# Patient Record
Sex: Male | Born: 2000 | Race: White | Hispanic: No | Marital: Single | Smoking: Never smoker
Health system: Southern US, Community
[De-identification: ages and names within clinical notes are randomized; demographics above are authoritative.]

---
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
## 2022-03-03 IMAGING — CT CT RENAL STONE PROTOCOL
3 of 4 series · 10 of 46 positions shown, 15 images · non-contrast
Comparison: None.

CLINICAL DATA: Left flank pain

EXAM:
CT ABDOMEN AND PELVIS WITHOUT CONTRAST
TECHNIQUE: Multidetector CT imaging of the abdomen and pelvis was performed
following the standard protocol without oral or IV contrast.

[Series 4: lung bases · axial · 0.67mm/px · z∈[+471,+571]mm · 6 of 30 slices shown, 11 images]
[im 5/30  soft-tissue]
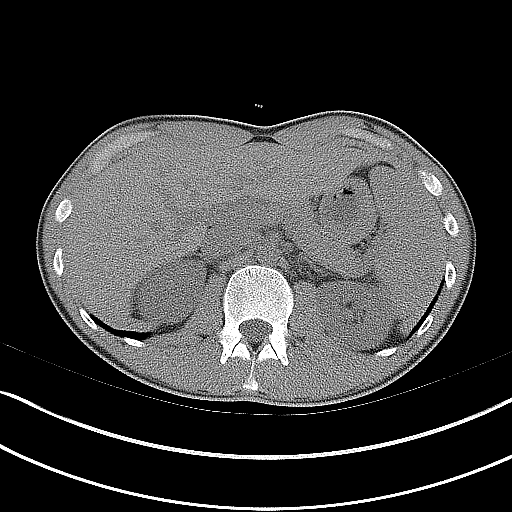
[im 5/30  bone]
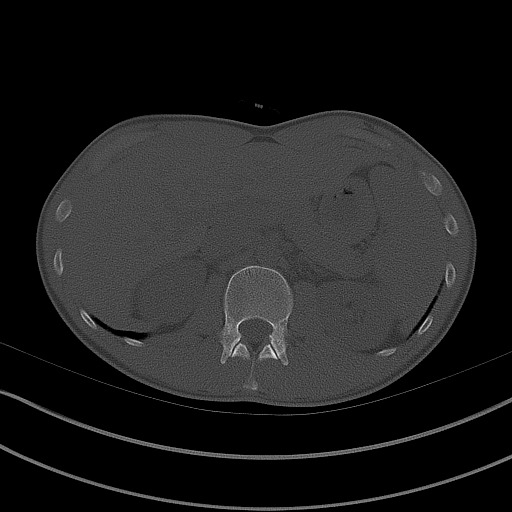
[im 9/30  soft-tissue]
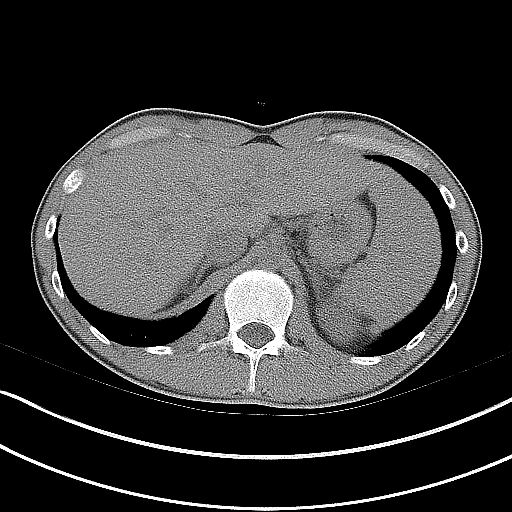
[im 13/30  soft-tissue]
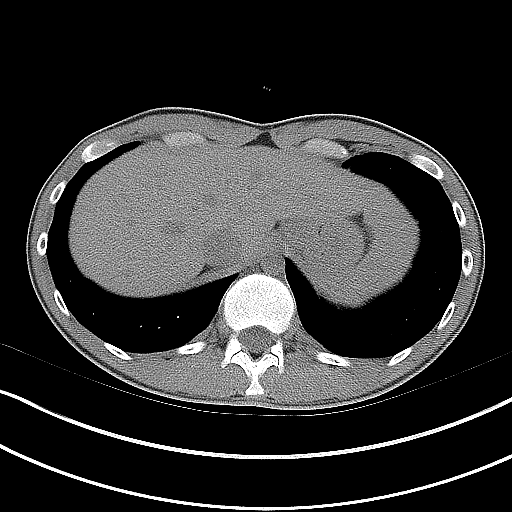
[im 13/30  lung]
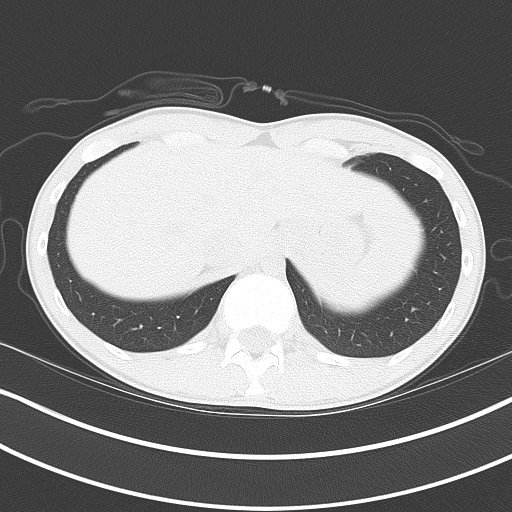
[im 17/30  soft-tissue]
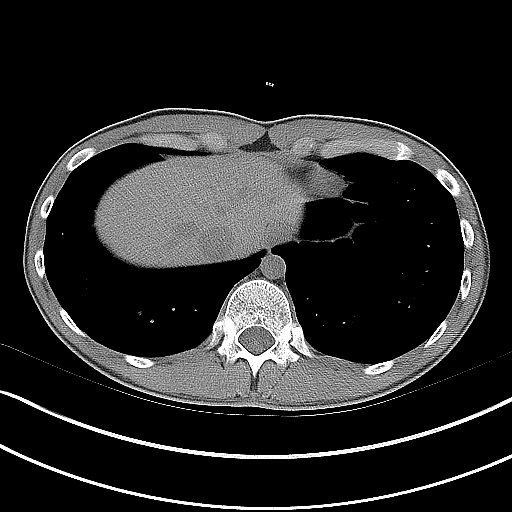
[im 17/30  lung]
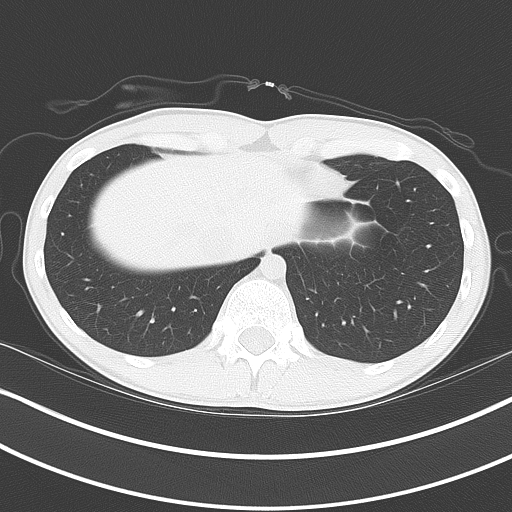
[im 21/30  soft-tissue]
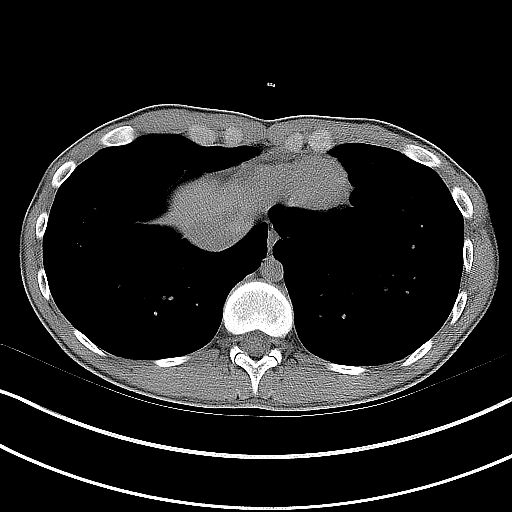
[im 21/30  lung]
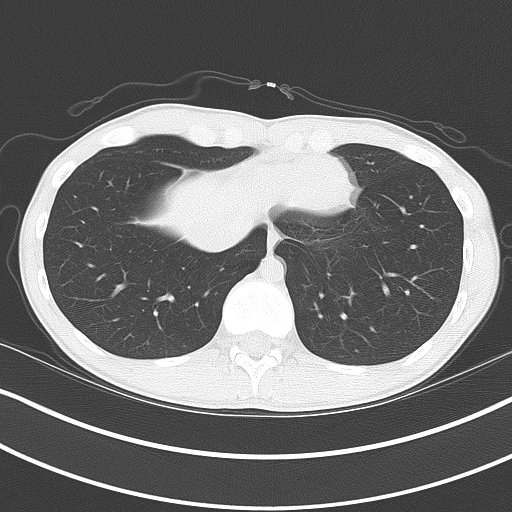
[im 25/30  soft-tissue]
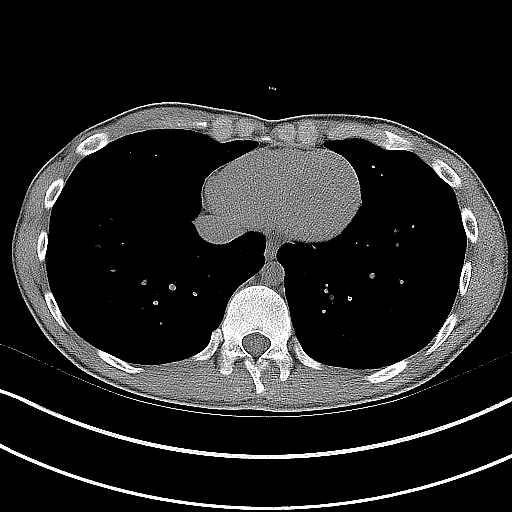
[im 25/30  lung]
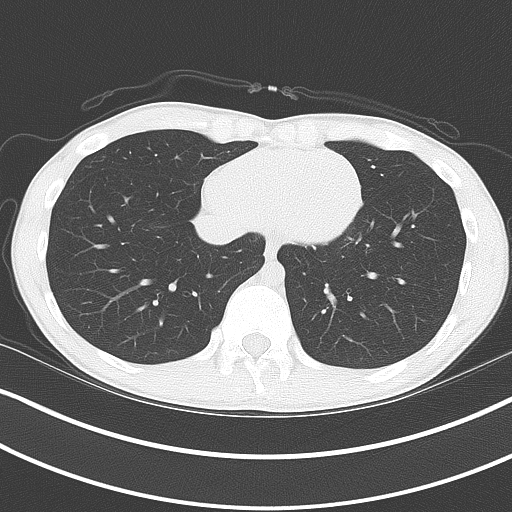

[Series 5: coronal · coronal · 0.70mm/px · 3 of 104 slices shown]
[im 35/104  soft-tissue]
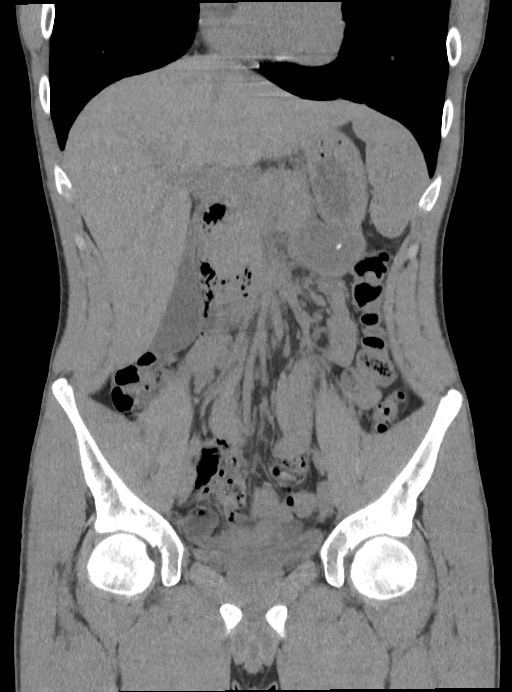
[im 46/104  soft-tissue]
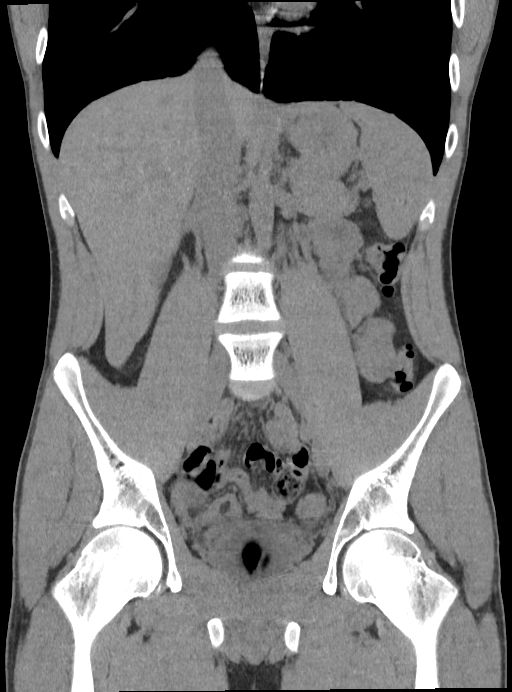
[im 58/104  soft-tissue]
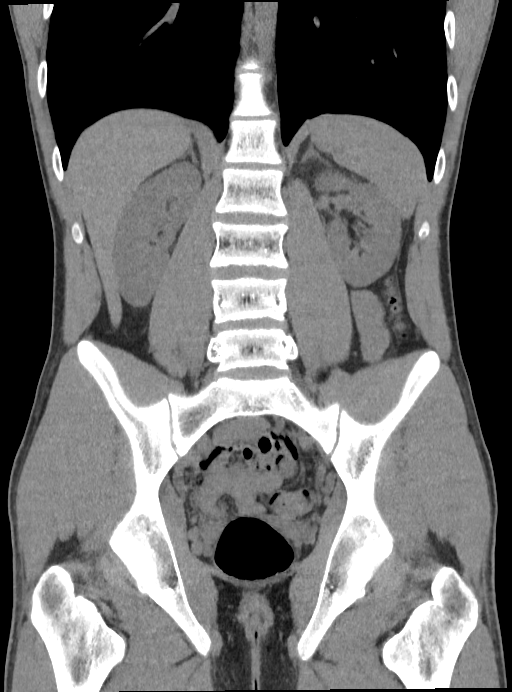

[Series 6: sagittal · sagittal · 0.42mm/px · 1 of 164 slices shown]
[im 55/164  soft-tissue]
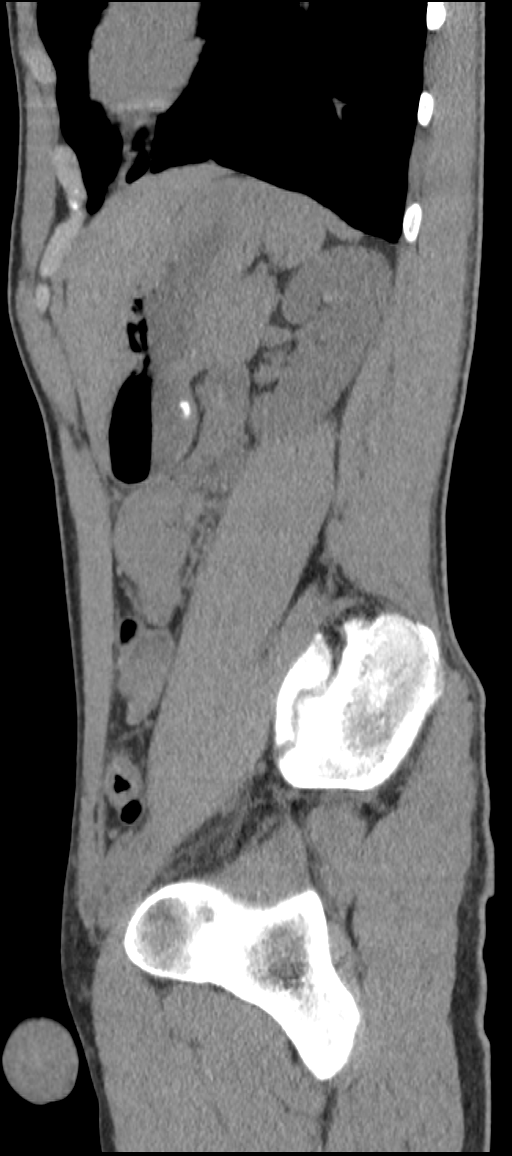

[10 of 46 positions shown; findings below may reference images not displayed]

FINDINGS: Lower chest: Lung bases are clear.

Hepatobiliary: No focal liver lesions are appreciable on this
noncontrast enhanced study. Gallbladder wall is appreciably
thickened. There is no biliary duct dilatation.

Pancreas: There is no evident pancreatic mass or inflammatory focus.

Spleen: No splenic lesions are appreciable.

Adrenals/Urinary Tract: Adrenals appear normal bilaterally. There is
no appreciable intrarenal mass or hydronephrosis on either side.
There is slight nephrocalcinosis bilaterally. No well-defined
intrarenal calculus evident. There is a 1 mm calculus at the left
ureterovesical junction. No other ureteral calculi are evident.
Urinary bladder wall thickness normal.

Stomach/Bowel: There are scattered sigmoid diverticula without
diverticulitis. There is no appreciable bowel wall or mesenteric
thickening. There is no evident bowel obstruction. No free air or
portal venous air.

Vascular/Lymphatic: There is no abdominal aortic aneurysm. No
vascular lesions are evident on this noncontrast enhanced study.
There is no evident adenopathy in the abdomen or pelvis.

Reproductive: Prostate and seminal vesicles are normal in size and
contour. No evident pelvic mass.

Other: The appendix region appears unremarkable without evident
inflammatory change. No abscess or ascites is evident in the abdomen
or pelvis.

Musculoskeletal: There is a probable small bone island in the
inferior left intertrochanteric femur region. There are no blastic
or lytic bone lesions. There is no intramuscular lesion. There is no
intramuscular or abdominal wall lesion.
IMPRESSION: 1. There is a 1 mm calculus at the left ureterovesical junction
without appreciable hydronephrosis. There is mild nephrocalcinosis
bilaterally.

2. There are sigmoid diverticula without diverticulitis. No bowel
wall thickening or bowel obstruction. No abscess in the abdomen
pelvis. The periappendiceal region appears unremarkable.

## 2022-05-11 ENCOUNTER — Emergency Department: Payer: Self-pay

## 2022-05-11 ENCOUNTER — Other Ambulatory Visit: Payer: Self-pay

## 2022-05-11 ENCOUNTER — Encounter: Payer: Self-pay | Admitting: *Deleted

## 2022-05-11 ENCOUNTER — Emergency Department
Admission: EM | Admit: 2022-05-11 | Discharge: 2022-05-11 | Disposition: A | Discharge status: Home / Self Care | Payer: Self-pay | Attending: Emergency Medicine | Admitting: Emergency Medicine

## 2022-05-11 DIAGNOSIS — T405X1A Poisoning by cocaine, accidental (unintentional), initial encounter: Secondary | ICD-10-CM | POA: Insufficient documentation

## 2022-05-11 DIAGNOSIS — T50901A Poisoning by unspecified drugs, medicaments and biological substances, accidental (unintentional), initial encounter: Secondary | ICD-10-CM

## 2022-05-11 MED ORDER — ONDANSETRON 4 MG PO TBDP
4.0000 mg | ORAL_TABLET | Freq: Once | ORAL | Status: AC
  Administered 2022-05-11: 4 mg via ORAL
  Filled 2022-05-11: qty 1

## 2022-05-11 NOTE — ED Provider Notes (Signed)
[facility] Provider Note    Event Date/Time   First MD Initiated Contact with Patient 05/11/22 [phone]     (approximate)   History   Drug Overdose   HPI  Ricky Guerrero is a 21 y.o. male brought to the ED via EMS from home status post unintentional overdose.  Patient admits to snorting a line of cocaine tonight.  On the way home he became unresponsive.  Mother gave him Narcan at home with good response.  Ice to the ED awake, alert and communicative.  Denies headache, vision changes, chest pain, shortness of breath, abdominal pain, nausea, vomiting or dizziness.  Denies active SI/HI/AH/VH.     Past Medical History  No past medical history on file.   Active Problem List  There are no problems to display for this patient.    Past Surgical History  No past surgical history on file.   Home Medications   Prior to Admission medications   Medication Sig Start Date End Date Taking? Authorizing Provider  ondansetron (ZOFRAN ODT) 4 MG disintegrating tablet Take 1 tablet (4 mg total) by mouth every 8 (eight) hours as needed for nausea or vomiting. 04/14/20   Johnn Hai, PA-C  tamsulosin (FLOMAX) 0.4 MG CAPS capsule Take 1 capsule (0.4 mg total) by mouth daily. 04/14/20   Johnn Hai, PA-C     Allergies  Penicillins   Family History  No family history on file.   Physical Exam  Triage Vital Signs: ED Triage Vitals  Enc Vitals Group     BP      Pulse      Resp      Temp      Temp src      SpO2      Weight      Height      Head Circumference      Peak Flow      Pain Score      Pain Loc      Pain Edu?      Excl. in [place]?     Updated Vital Signs: BP 123/66   Pulse 60   Temp 97.6 F (36.4 C) (Oral)   Resp 17   Ht 6\' 2"  (1.88 m)   Wt 65.8 kg   SpO2 100%   BMI 18.62 kg/m    General: Awake, no distress.  CV:  PERRL.  Good peripheral perfusion.  Resp:  Normal effort.  CTA B. Abd:  Nontender.  No distention.  Other:  Alert and  oriented to person and place.  CN II-XII grossly intact.  5/5 motor strength and sensation all extremities.  M AE x4.  Normal affect.   ED Results / Procedures / Treatments  Labs (all labs ordered are listed, but only abnormal results are displayed) Labs Reviewed - No data to display   EKG  ED ECG REPORT I, Aylee Littrell J, the attending physician, personally viewed and interpreted this ECG.   Date: 05/11/2022  EKG Time: 0047  Rate: 66  Rhythm: normal sinus rhythm  Axis: Normal  Intervals:none  ST&T Change: Nonspecific    RADIOLOGY I have independently visualized and interpreted patient's CT and chest x-ray as well as noted the radiology interpretation:  CT head: No ICH  Chest x-ray: No acute cardiopulmonary process  Official radiology report(s): DG Chest 2 View  Result Date: 05/11/2022 CLINICAL DATA:  Overdose EXAM: CHEST - 2 VIEW COMPARISON:  None Available. FINDINGS: Lungs are clear.  No  pleural effusion or pneumothorax. The heart is normal in size. Visualized osseous structures are within normal limits. IMPRESSION: Normal chest radiographs. Electronically Signed   By: Julian Hy M.D.   On: 05/11/2022 01:12   CT Head Wo Contrast  Result Date: 05/11/2022 CLINICAL DATA:  Mental status change of unknown cause.  Headache. EXAM: CT HEAD WITHOUT CONTRAST TECHNIQUE: Contiguous axial images were obtained from the base of the skull through the vertex without intravenous contrast. RADIATION DOSE REDUCTION: This exam was performed according to the departmental dose-optimization program which includes automated exposure control, adjustment of the mA and/or kV according to patient size and/or use of iterative reconstruction technique. COMPARISON:  None Available. FINDINGS: Brain: No evidence of acute infarction, hemorrhage, hydrocephalus, extra-axial collection or mass lesion/mass effect. Vascular: No hyperdense vessel or unexpected calcification. Skull: Normal. Negative for fracture  or focal lesion. Sinuses/Orbits: No acute finding. Other: None. IMPRESSION: No acute intracranial abnormalities. Electronically Signed   By: Lucienne Capers M.D.   On: 05/11/2022 00:59     PROCEDURES:  Critical Care performed: No  .1-3 Lead EKG Interpretation  Performed by: Paulette Blanch, MD Authorized by: Paulette Blanch, MD     Interpretation: normal     ECG rate:  88   ECG rate assessment: normal     Rhythm: sinus rhythm     Ectopy: none     Conduction: normal   Comments:     Patient placed on cardiac monitor to evaluate for arrhythmias    MEDICATIONS ORDERED IN ED: Medications  ondansetron (ZOFRAN-ODT) disintegrating tablet 4 mg (4 mg Oral Given 05/11/22 0424)     IMPRESSION / MDM / ASSESSMENT AND PLAN / ED COURSE  I reviewed the triage vital signs and the nursing notes.                             21 year old male presenting with overdose. Differential diagnosis includes, but is not limited to, alcohol, illicit or prescription medications, or other toxic ingestion; intracranial pathology such as stroke or intracerebral hemorrhage; fever or infectious causes including sepsis; hypoxemia and/or hypercarbia; uremia; trauma; endocrine related disorders such as diabetes, hypoglycemia, and thyroid-related diseases; hypertensive encephalopathy; etc. I have personally reviewed patient's records and note a urgent care visit from May 2022 for abdominal pain.  Patient's presentation is most consistent with acute presentation with potential threat to life or bodily function.  The patient is on the cardiac monitor to evaluate for evidence of arrhythmia and/or significant heart rate changes.  Patient states he is "deathly afraid of needles".  Given he is awake and hemodynamically stable, will obtain CT head, chest x-ray, EKG and cardiac monitor.  Clinical Course as of 05/11/22 0453  Tue May 11, 2022  0118 Chest x-ray and CT head unremarkable.  We will continue to monitor and care for  patient. [JS]  [id number] General resting in no acute distress, drinking a cup of water.  Mother at bedside.  Updated both on negative CT, chest x-ray and EKG results.  We will continue to monitor and care for patient. [JS]  0420 Complains of nausea; will administer ODT Zofran. [JS]  [phone] Patient received ODT Zofran and is eager for discharge home, getting agitated at his mother.  He is not tachypneic, apneic nor hypoxic.  Strict return precautions given.  Patient and mother verbalized understanding agree with plan of care. [JS]    Clinical Course User Index [JS] Paulette Blanch, MD  FINAL CLINICAL IMPRESSION(S) / ED DIAGNOSES   Final diagnoses:  Accidental overdose, initial encounter     Rx / DC Orders   ED Discharge Orders     None        Note:  This document was prepared using Dragon voice recognition software and may include unintentional dictation errors.   Paulette Blanch, MD 05/11/22 [phone]

## 2022-05-11 NOTE — Discharge Instructions (Signed)
Return to the ER for worsening symptoms, persistent vomiting, lethargy, difficulty breathing or other concerns. 

## 2022-05-11 NOTE — ED Notes (Signed)
Pt brought in via ems. Pt has a headache.  No chest pain or sob.  Pt denies SI or HI.  Pt denies etoh use.  Pt calm.  Pt refusing blood work at this time.  Pt in hallway bed.  Pt alert  md at bedside on arrival to er

## 2022-05-11 NOTE — ED Notes (Signed)
Patient is being discharged home with mom. Patient drank a ginger-ale and sprite.

## 2022-05-11 NOTE — ED Notes (Signed)
Mother with pt.

## 2022-05-11 NOTE — ED Triage Notes (Signed)
Pt brought in via ems after snorting a line of cocaine tonight.  Pt was unresponsive in the car per ems, mother gave narcan.  On arrival to er, pt alert.  Pt refused blood work.  Md at bedside  pt in hallway bed.

## 2022-05-11 NOTE — ED Notes (Signed)
Md with pt and family

## 2022-05-11 NOTE — ED Notes (Addendum)
Assumed care of patient. Mom is at bedside. Patient is resting in bed.
